# Patient Record
Sex: Female | Born: 2000 | Race: Black or African American | Hispanic: No | Marital: Single | Smoking: Never smoker
Health system: Southern US, Community
[De-identification: ages and names within clinical notes are randomized; demographics above are authoritative.]

## PROBLEM LIST (undated history)

## (undated) DIAGNOSIS — K912 Postsurgical malabsorption, not elsewhere classified: Secondary | ICD-10-CM

## (undated) DIAGNOSIS — K90829 Short bowel syndrome, unspecified: Secondary | ICD-10-CM

## (undated) HISTORY — PX: OSTOMY TAKE DOWN: SHX2142

---
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
## 2022-11-03 ENCOUNTER — Encounter (HOSPITAL_COMMUNITY): Payer: Self-pay

## 2022-11-03 ENCOUNTER — Ambulatory Visit (HOSPITAL_COMMUNITY)
Admission: EM | Admit: 2022-11-03 | Discharge: 2022-11-03 | Disposition: A | Discharge status: Home / Self Care | Payer: Medicaid Other | Attending: Physician Assistant | Admitting: Physician Assistant

## 2022-11-03 DIAGNOSIS — R350 Frequency of micturition: Secondary | ICD-10-CM

## 2022-11-03 DIAGNOSIS — J020 Streptococcal pharyngitis: Secondary | ICD-10-CM | POA: Diagnosis not present

## 2022-11-03 LAB — POCT URINALYSIS DIPSTICK, ED / UC
Bilirubin Urine: NEGATIVE
Glucose, UA: NEGATIVE mg/dL
Hgb urine dipstick: NEGATIVE
Ketones, ur: 15 mg/dL — AB
Leukocytes,Ua: NEGATIVE
Nitrite: NEGATIVE
Protein, ur: NEGATIVE mg/dL
Specific Gravity, Urine: 1.02 (ref 1.005–1.030)
Urobilinogen, UA: 4 mg/dL — ABNORMAL HIGH (ref 0.0–1.0)
pH: 7 (ref 5.0–8.0)

## 2022-11-03 LAB — POC URINE PREG, ED: Preg Test, Ur: NEGATIVE

## 2022-11-03 LAB — POCT RAPID STREP A, ED / UC: Streptococcus, Group A Screen (Direct): POSITIVE — AB

## 2022-11-03 MED ORDER — METHYLPREDNISOLONE SODIUM SUCC 125 MG IJ SOLR
60.0000 mg | Freq: Once | INTRAMUSCULAR | Status: AC
  Administered 2022-11-03: 60 mg via INTRAMUSCULAR

## 2022-11-03 MED ORDER — AMOXICILLIN 500 MG PO CAPS: 500.0000 mg | ORAL_CAPSULE | Freq: Two times a day (BID) | ORAL | 0 refills | Status: AC

## 2022-11-03 MED ORDER — METHYLPREDNISOLONE SODIUM SUCC 125 MG IJ SOLR
INTRAMUSCULAR | Status: AC
  Filled 2022-11-03: qty 2

## 2022-11-03 NOTE — ED Provider Notes (Signed)
[place]    CSN: [id number] Arrival date & time: 11/03/22  1007      History   Chief Complaint Chief Complaint  Patient presents with   Fever   Chills   Headache    HPI Dana Charles is a 22 y.o. female.   Patient presents today with a 24-hour history of fever, chills, widespread body pain, headache, sore throat.  Denies any significant cough, congestion, nausea, vomiting, diarrhea.  She has tried ibuprofen without improvement of symptoms.  Denies any known sick contacts.  She has had COVID several years ago.  Has not had influenza or COVID-19 vaccinations.  Denies any recent antibiotics or steroids.  She does have a history of asthma when she was younger but has not required albuterol recently.  Denies hospitalization related to asthma.  She does not believe that she could be pregnant but is not confident.  She does report some urinary symptoms including frequency but states this has been ongoing.  Denies any dysuria, hematuria, pelvic pain, vaginal symptoms.    Past Medical History:  Diagnosis Date   Asthma    Short gut syndrome     There are no problems to display for this patient.   Past Surgical History:  Procedure Laterality Date   OSTOMY TAKE DOWN      OB History   No obstetric history on file.      Home Medications    Prior to Admission medications   Medication Sig Start Date End Date Taking? Authorizing Provider  amoxicillin (AMOXIL) 500 MG capsule Take 1 capsule (500 mg total) by mouth 2 (two) times daily. 11/03/22  Yes Javarius Tsosie K, PA-C  albuterol (PROVENTIL HFA;VENTOLIN HFA) 108 (90 BASE) MCG/ACT inhaler Inhale 2 puffs into the lungs every 6 (six) hours as needed. For shortness of breath     [provider]    Family History History reviewed. No pertinent family history.  Social History Social History   Tobacco Use   Smoking status: Never   Smokeless tobacco: Never     Allergies   Patient has no known  allergies.   Review of Systems Review of Systems  Constitutional:  Positive for activity change, appetite change, chills and fever. Negative for fatigue.  HENT:  Positive for sore throat. Negative for congestion, sinus pressure, sneezing and trouble swallowing.   Respiratory:  Negative for cough and shortness of breath.   Cardiovascular:  Negative for chest pain.  Gastrointestinal:  Negative for abdominal pain, diarrhea, nausea and vomiting.  Genitourinary:  Positive for frequency. Negative for dysuria and urgency.  Musculoskeletal:  Positive for arthralgias and myalgias.  Neurological:  Positive for headaches. Negative for dizziness and light-headedness.     Physical Exam Triage Vital Signs ED Triage Vitals [11/03/22 1105]  Enc Vitals Group     BP 115/70     Pulse Rate (!) 141     Resp 16     Temp 99.8 F (37.7 C)     Temp Source Oral     SpO2 99 %     Weight      Height      Head Circumference      Peak Flow      Pain Score      Pain Loc      Pain Edu?      Excl. in [place]?    No data found.  Updated Vital Signs BP 115/70 (BP Location: Left Arm)   Pulse 98   Temp  99.8 F (37.7 C) (Oral)   Resp 16   LMP 10/21/2022   SpO2 99%   Visual Acuity Right Eye Distance:   Left Eye Distance:   Bilateral Distance:    Right Eye Near:   Left Eye Near:    Bilateral Near:     Physical Exam Vitals reviewed.  Constitutional:      General: She is awake. She is not in acute distress.    Appearance: Normal appearance. She is well-developed. She is not ill-appearing.     Comments: Very pleasant female appears stated age in no acute distress sitting comfortably in exam room  HENT:     Head: Normocephalic and atraumatic.     Right Ear: Tympanic membrane, ear canal and external ear normal. Tympanic membrane is not erythematous or bulging.     Left Ear: Tympanic membrane, ear canal and external ear normal. Tympanic membrane is not erythematous or bulging.     Nose:     Right  Sinus: No maxillary sinus tenderness or frontal sinus tenderness.     Left Sinus: No maxillary sinus tenderness or frontal sinus tenderness.     Mouth/Throat:     Pharynx: Uvula midline. Posterior oropharyngeal erythema present. No oropharyngeal exudate.     Tonsils: Tonsillar exudate present. No tonsillar abscesses. 2+ on the right. 2+ on the left.  Cardiovascular:     Rate and Rhythm: Normal rate and regular rhythm.     Heart sounds: Normal heart sounds, S1 normal and S2 normal. No murmur heard. Pulmonary:     Effort: Pulmonary effort is normal.     Breath sounds: Normal breath sounds. No wheezing, rhonchi or rales.     Comments: Clear to auscultation bilaterally Abdominal:     General: Bowel sounds are normal.     Palpations: Abdomen is soft.     Tenderness: There is no abdominal tenderness. There is no right CVA tenderness, left CVA tenderness, guarding or rebound.  Lymphadenopathy:     Head:     Right side of head: No submental, submandibular or tonsillar adenopathy.     Left side of head: No submental, submandibular or tonsillar adenopathy.     Cervical: No cervical adenopathy.  Psychiatric:        Behavior: Behavior is cooperative.      UC Treatments / Results  Labs (all labs ordered are listed, but only abnormal results are displayed) Labs Reviewed  POCT RAPID STREP A, ED / UC - Abnormal; Notable for the following components:      Result Value   Streptococcus, Group A Screen (Direct) POSITIVE (*)    All other components within normal limits  POCT URINALYSIS DIPSTICK, ED / UC - Abnormal; Notable for the following components:   Ketones, ur 15 (*)    Urobilinogen, UA 4.0 (*)    All other components within normal limits  POC URINE PREG, ED    EKG   Radiology No results found.  Procedures Procedures (including critical care time)  Medications Ordered in UC Medications  methylPREDNISolone sodium succinate (SOLU-MEDROL) 125 mg/2 mL injection 60 mg (60 mg  Intramuscular Given 11/03/22 1149)    Initial Impression / Assessment and Plan / UC Course  I have reviewed the triage vital signs and the nursing notes.  Pertinent labs & imaging results that were available during my care of the patient were reviewed by me and considered in my medical decision making (see chart for details).     Patient was tachycardic but this  normalized on retake.  She is well-appearing, afebrile, nontoxic.  Urine pregnancy was obtained and was negative.  She did report some urinary frequency and so UA was obtained which did not show any evidence of infection.  She tested positive for strep pharyngitis.  She was started on amoxicillin 500 mg twice daily for 10 days.  Discussed that she should dispose of her toothbrush a few days after starting medication to prevent reinfection.  She is contagious for 24 hours after starting medicine.  Given swelling and pain of tonsil she was given an injection of Solu-Medrol in clinic to help manage the symptoms.  Discussed that if her symptoms are not improving within 24 hours she should return for reevaluation.  If she has any worsening symptoms including swelling of her throat, shortness of breath, muffled voice, fever not respond to antipyretics, dysphagia she should be seen immediately.  Strict return precautions given.  Final Clinical Impressions(s) / UC Diagnoses   Final diagnoses:  Strep pharyngitis     Discharge Instructions      You tested positive for strep.  Start amoxicillin twice daily for 10 days.  Throw away your toothbrush a few days after starting the medication in order to prevent reinfection.  Gargle with warm salt water.  Alternate Tylenol ibuprofen for pain.  If your symptoms or not improving within a few days please return for reevaluation.  If you have any difficulty swallowing, shortness of breath, muffled voice, fever not responding medication you should be seen immediately.  You are contagious for 24 hours after  starting the medication.     ED Prescriptions     Medication Sig Dispense Auth. Provider   amoxicillin (AMOXIL) 500 MG capsule Take 1 capsule (500 mg total) by mouth 2 (two) times daily. 20 capsule Shyne Lehrke, Derry Skill, PA-C      PDMP not reviewed this encounter.   Terrilee Croak, PA-C 11/03/22 1203

## 2022-11-03 NOTE — Discharge Instructions (Addendum)
You tested positive for strep.  Start amoxicillin twice daily for 10 days.  Throw away your toothbrush a few days after starting the medication in order to prevent reinfection.  Gargle with warm salt water.  Alternate Tylenol ibuprofen for pain.  If your symptoms or not improving within a few days please return for reevaluation.  If you have any difficulty swallowing, shortness of breath, muffled voice, fever not responding medication you should be seen immediately.  You are contagious for 24 hours after starting the medication.

## 2022-11-03 NOTE — ED Triage Notes (Signed)
2- day h/o headache,body aches and chills. Pt thinks she has the flu.

## 2023-01-22 ENCOUNTER — Telehealth (INDEPENDENT_AMBULATORY_CARE_PROVIDER_SITE_OTHER): Payer: Medicaid Other

## 2023-01-22 DIAGNOSIS — Z3492 Encounter for supervision of normal pregnancy, unspecified, second trimester: Secondary | ICD-10-CM

## 2023-01-22 DIAGNOSIS — Z3490 Encounter for supervision of normal pregnancy, unspecified, unspecified trimester: Secondary | ICD-10-CM | POA: Insufficient documentation

## 2023-01-22 DIAGNOSIS — Z3480 Encounter for supervision of other normal pregnancy, unspecified trimester: Secondary | ICD-10-CM

## 2023-01-22 DIAGNOSIS — Z3689 Encounter for other specified antenatal screening: Secondary | ICD-10-CM

## 2023-01-22 MED ORDER — BLOOD PRESSURE KIT DEVI: 1.0000 | 0 refills | Status: DC | PRN

## 2023-01-22 MED ORDER — BLOOD PRESSURE KIT DEVI: 1.0000 | 0 refills | Status: AC | PRN

## 2023-01-22 NOTE — Patient Instructions (Addendum)

## 2023-01-22 NOTE — Addendum Note (Signed)
Addended by: Brien Mates T on: 01/22/2023 02:32 PM   Modules accepted: Orders

## 2023-01-22 NOTE — Progress Notes (Signed)
New OB Intake  I connected with Dana Charles  on 01/22/23 at  9:15 AM EDT by MyChart Video Visit and verified that I am speaking with the correct person using two identifiers. Nurse is located at [facility] and pt is located at home.  I discussed the limitations, risks, security and privacy concerns of performing an evaluation and management service by telephone and the availability of in person appointments. I also discussed with the patient that there may be a patient responsible charge related to this service. The patient expressed understanding and agreed to proceed.  I explained I am completing New OB Intake today. We discussed EDD of 07/28/2023 that is based on LMP of 10/21/2022. Pt is G1/P0. I reviewed her allergies, medications, Medical/Surgical/OB history, and appropriate screenings. I informed her of [facility] services. [facility] information placed in AVS. Based on history, this is a low risk pregnancy.  There are no problems to display for this patient.   Concerns addressed today  Delivery Plans Plans to deliver at [facility]. Patient given information for [facility] Healthy [facility] website for more information about [facility].   MyChart/Babyscripts MyChart access verified. I explained pt will have some visits in office and some virtually. Babyscripts instructions given and order placed. Patient verifies receipt of registration text/e-mail. Account successfully created and app downloaded.  Blood Pressure Cuff/Weight Scale Blood pressure cuff ordered for patient to pick-up from [employer]. Explained after first prenatal appt pt will check weekly and document in Babyscripts.   Anatomy US Explained first scheduled [place] will be around 19 weeks. Anatomy US scheduled for 03/06/2023 at 8:30am. Pt notified to arrive at 8:15am.  Labs Discussed Natera genetic screening with patient. Would like both Panorama and Horizon drawn at new OB visit. Routine prenatal labs needed.  COVID  Vaccine Patient has had COVID vaccine.   Is patient a CenteringPregnancy candidate?  Not a Candidate Declined due to Enrolled in [facility]   Is patient a Mom+Baby Combined Care candidate?  Accepted   If accepted, Mom+Baby staff notified  Social Determinants of Health Food Insecurity: Patient denies food insecurity. WIC Referral: Patient is interested in referral to [facility]  Transportation: Patient denies transportation needs. Childcare: Discussed no children allowed at ultrasound appointments. Offered childcare services; patient declines childcare services at this time.  Interested in [place]? If yes, send referral and doula dot phrase.   First visit review I reviewed new OB appt with patient. I explained they will have a provider visit that includes new ob labs and listening to baby heartbeat. Explained pt will be seen by Edd Arbour, CNM at first visit; encounter routed to appropriate provider. Explained that patient will be seen by pregnancy navigator following visit with provider.   Lowry Bowl, CMA 01/22/2023  8:59 AM

## 2023-01-27 ENCOUNTER — Other Ambulatory Visit: Payer: Self-pay

## 2023-01-27 ENCOUNTER — Other Ambulatory Visit (HOSPITAL_COMMUNITY)
Admission: RE | Admit: 2023-01-27 | Discharge: 2023-01-27 | Disposition: A | Discharge status: Home / Self Care | Payer: Medicaid Other | Source: Ambulatory Visit | Attending: Certified Nurse Midwife | Admitting: Certified Nurse Midwife

## 2023-01-27 ENCOUNTER — Ambulatory Visit (INDEPENDENT_AMBULATORY_CARE_PROVIDER_SITE_OTHER): Payer: Medicaid Other | Admitting: Family Medicine

## 2023-01-27 ENCOUNTER — Encounter: Payer: Self-pay | Admitting: Family Medicine

## 2023-01-27 VITALS — BP 111/80 | HR 96 | Wt 128.4 lb

## 2023-01-27 DIAGNOSIS — L292 Pruritus vulvae: Secondary | ICD-10-CM | POA: Insufficient documentation

## 2023-01-27 DIAGNOSIS — B3731 Acute candidiasis of vulva and vagina: Secondary | ICD-10-CM

## 2023-01-27 DIAGNOSIS — J4520 Mild intermittent asthma, uncomplicated: Secondary | ICD-10-CM | POA: Diagnosis not present

## 2023-01-27 DIAGNOSIS — Z3492 Encounter for supervision of normal pregnancy, unspecified, second trimester: Secondary | ICD-10-CM

## 2023-01-27 DIAGNOSIS — J45909 Unspecified asthma, uncomplicated: Secondary | ICD-10-CM | POA: Insufficient documentation

## 2023-01-27 DIAGNOSIS — K90829 Short bowel syndrome, unspecified: Secondary | ICD-10-CM | POA: Diagnosis not present

## 2023-01-27 NOTE — Addendum Note (Signed)
Addended by: Geanie Berlin on: 01/27/2023 02:06 PM   Modules accepted: Orders

## 2023-01-27 NOTE — Progress Notes (Signed)
INITIAL PRENATAL VISIT  Subjective:   Dana Charles is being seen today for her first obstetrical visit.  This is not a planned pregnancy. This is a desired pregnancy.  She is at [redacted]w[redacted]d gestation by LMP. 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 Relationship with FOB:  involved . Patient does intend to breast feed. Pregnancy history fully reviewed.  Patient reports no complaints- has noticed labial itching. Ice helps  Indications for ASA therapy (per uptodate) One of the following: Previous pregnancy with preeclampsia, especially early onset and with an adverse outcome No Multifetal gestation No Chronic hypertension No Type 1 or 2 diabetes mellitus No Chronic kidney disease No Autoimmune disease (antiphospholipid syndrome, systemic lupus erythematosus) No  Two or more of the following: Nulliparity Yes Obesity (body mass index >30 kg/m2) No Family history of preeclampsia in mother or sister No Age ?35 years No Sociodemographic characteristics (African American race, low socioeconomic level) Yes Personal risk factors (eg, previous pregnancy with low birth weight or small for gestational age infant, previous adverse pregnancy outcome [eg, stillbirth], interval >10 years between pregnancies) No  Indications for early GDM screening  First-degree relative with diabetes No BMI >30kg/m2 No Age > 25 No Previous birth of an infant weighing ?4000 g No Gestational diabetes mellitus in a previous pregnancy No Glycated hemoglobin ?5.7 percent (39 mmol/mol), impaired glucose tolerance, or impaired fasting glucose on previous testing No High-risk race/ethnicity (eg, African American, Latino, Native American, Asian American, Pacific Islander) Yes Previous stillbirth of unknown cause No Maternal birthweight > 9 lbs No History of cardiovascular disease No Hypertension or on therapy for hypertension No High-density lipoprotein cholesterol level <35  mg/dL (4.13 mmol/L) and/or a triglyceride level >250 mg/dL (2.44 mmol/L) No Polycystic ovary syndrome No Physical inactivity No Other clinical condition associated with insulin resistance (eg, severe obesity, acanthosis nigricans) No Current use of glucocorticoids No   Early screening tests: FBS, A1C, Random CBG, glucose challenge   Review of Systems:   Review of Systems  Objective:    Obstetric History OB History  Gravida Para Term Preterm AB Living  1            SAB IAB Ectopic Multiple Live Births               # Outcome Date GA Lbr Len/2nd Weight Sex Delivery Anes PTL Lv  1 Current             Past Medical History:  Diagnosis Date   Asthma    Constipation 07/07/2013   H/O resection of small bowel 07/07/2013   Hematochezia 12/25/2014   Irritable bowel syndrome 08/06/2011   Short gut syndrome     Past Surgical History:  Procedure Laterality Date   OSTOMY TAKE DOWN      Current Outpatient Medications on File Prior to Visit  Medication Sig Dispense Refill   albuterol (PROVENTIL HFA;VENTOLIN HFA) 108 (90 BASE) MCG/ACT inhaler Inhale 2 puffs into the lungs every 6 (six) hours as needed. For shortness of breath      ALBUTEROL IN Inhale into the lungs.     Blood Pressure Monitoring (BLOOD PRESSURE KIT) DEVI 1 each by Does not apply route as needed. 1 each 0   Prenatal Vit-DSS-Fe Fum-FA (PRENATAL 19) tablet Take 1 tablet by mouth daily.     Prenatal Vit-Fe Fumarate-FA (PRENATAL VITAMINS) 28-0.8 MG TABS Take 1 tablet by mouth daily.     amoxicillin (AMOXIL) 500 MG capsule Take  1 capsule (500 mg total) by mouth 2 (two) times daily. (Patient not taking: Reported on 01/22/2023) 20 capsule 0   fluticasone-salmeterol (ADVAIR DISKUS) 100-50 MCG/ACT AEPB Inhale into the lungs. (Patient not taking: Reported on 01/22/2023)     hyoscyamine (LEVSIN) 0.125 MG tablet Take by mouth. (Patient not taking: Reported on 01/22/2023)     polyethylene glycol powder (GLYCOLAX/MIRALAX) 17 GM/SCOOP  powder 17 grams po hourly until stools clear, then 17 grams daily. Adjust daily dose for goal stools (Patient not taking: Reported on 01/22/2023)     No current facility-administered medications on file prior to visit.    No Known Allergies  Social History:  reports that she has never smoked. She has never used smokeless tobacco. She reports that she does not currently use drugs after having used the following drugs: Marijuana. She reports that she does not drink alcohol.  Family History  Problem Relation Age of Onset   Lupus Mother    Narcolepsy Mother    Healthy Father     The following portions of the patient's history were reviewed and updated as appropriate: allergies, current medications, past family history, past medical history, past social history, past surgical history and problem list.  Review of Systems Review of Systems  Constitutional:  Negative for chills and fever.  HENT:  Negative for congestion and sore throat.   Eyes:  Negative for pain and visual disturbance.  Respiratory:  Negative for cough, chest tightness and shortness of breath.   Cardiovascular:  Negative for chest pain.  Gastrointestinal:  Positive for constipation (chronic and manages with miralax). Negative for abdominal pain, diarrhea, nausea and vomiting.  Endocrine: Negative for cold intolerance and heat intolerance.  Genitourinary:  Negative for dysuria and flank pain.  Musculoskeletal:  Negative for back pain.  Skin:  Negative for rash.  Allergic/Immunologic: Negative for food allergies.  Neurological:  Negative for dizziness and light-headedness.  Psychiatric/Behavioral:  Negative for agitation.       Physical Exam:  BP 111/80   Pulse 96   Wt 128 lb 6.4 oz (58.2 kg)   LMP 10/21/2022  CONSTITUTIONAL: Well-developed, well-nourished female in no acute distress.  HENT:  Normocephalic, atraumatic, External right and left ear normal. Oropharynx is clear and moist EYES: Conjunctivae normal. No  scleral icterus.  NECK: Normal range of motion, supple, no masses.  Normal thyroid.  SKIN: Skin is warm and dry. No rash noted. Not diaphoretic. No erythema. No pallor. MUSCULOSKELETAL: Normal range of motion. No tenderness.  No cyanosis, clubbing, or edema.   NEUROLOGIC: Alert and oriented to person, place, and time. Normal muscle tone coordination.  PSYCHIATRIC: Normal mood and affect. Normal behavior. Normal judgment and thought content. CARDIOVASCULAR: Normal heart rate noted, regular rhythm RESPIRATORY: Clear to auscultation bilaterally. Effort and breath sounds normal, no problems with respiration noted. BREASTS: Symmetric in size. No masses, skin changes, nipple drainage, or lymphadenopathy. ABDOMEN: Soft, normal bowel sounds, no distention noted.  No tenderness, rebound or guarding. Fundal ht: 14. 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  PELVIC: Normal appearing external genitalia- no rash or lesions; normal appearing vaginal mucosa and cervix.  +Thick white discharge.  .  Normal uterine size, no other palpable masses, no uterine or adnexal tenderness. FHR: 153   Assessment:    Pregnancy: G1P0000 1. Encounter for supervision of low-risk pregnancy in second trimester - Reviewed care team and [facility] model - CBC/D/Plt+RPR+Rh+ABO+RubIgG... - GC/Chlamydia probe amp ([place])not at [facility] - Culture, OB Urine - Hemoglobin A1c - Panorama  and Horizon also ordered  2. 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 She reported her gallbladder was also removed - review of Care everywhere-- had bowel resection at possibly 22 yo for blockage? (See in outpatient Office note when patient was 22 yo).  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 - Patient was seen relatively recently for abdominal pain thought to be related to adherence of her  colostomy skin scar to her abdominal fascia      Plan:     Initial labs drawn. Prenatal vitamins. Problem list reviewed and updated. Reviewed in detail the nature of the practice with collaborative care between  Genetic screening discussed: NIPS/ ordered. Role of ultrasound in pregnancy discussed; Anatomy US: ordered. Amniocentesis discussed: not indicated. Follow up in 4 weeks. Discussed clinic routines, schedule of care and testing, genetic screening options, involvement of students and residents under the direct supervision of APPs and doctors and presence of female providers. Pt verbalized understanding.  Future Appointments  Date Time Provider Department Center  02/24/2023 10:55 AM Sue Lush, FNP [facility]  03/06/2023  8:30 AM [facility]  03/26/2023  3:55 PM Federico Flake, MD [facility]     Federico Flake, MD 01/27/2023 12:53 PM

## 2023-01-28 LAB — CBC/D/PLT+RPR+RH+ABO+RUBIGG...
Antibody Screen: NEGATIVE
Basophils Absolute: 0.1 10*3/uL (ref 0.0–0.2)
Basos: 1 %
EOS (ABSOLUTE): 0.1 10*3/uL (ref 0.0–0.4)
Eos: 1 %
HCV Ab: NONREACTIVE
HIV Screen 4th Generation wRfx: NONREACTIVE
Hematocrit: 38.4 % (ref 34.0–46.6)
Hemoglobin: 12.3 g/dL (ref 11.1–15.9)
Hepatitis B Surface Ag: NEGATIVE
Immature Grans (Abs): 0 10*3/uL (ref 0.0–0.1)
Immature Granulocytes: 0 %
Lymphocytes Absolute: 1.7 10*3/uL (ref 0.7–3.1)
Lymphs: 22 %
MCH: 27.5 pg (ref 26.6–33.0)
MCHC: 32 g/dL (ref 31.5–35.7)
MCV: 86 fL (ref 79–97)
Monocytes Absolute: 0.6 10*3/uL (ref 0.1–0.9)
Monocytes: 8 %
Neutrophils Absolute: 5.1 10*3/uL (ref 1.4–7.0)
Neutrophils: 68 %
Platelets: 234 10*3/uL (ref 150–450)
RBC: 4.48 x10E6/uL (ref 3.77–5.28)
RDW: 14.9 % (ref 11.7–15.4)
RPR Ser Ql: NONREACTIVE
Rh Factor: POSITIVE
Rubella Antibodies, IGG: 3.81 index (ref >0.99)
WBC: 7.5 10*3/uL (ref 3.4–10.8)

## 2023-01-28 LAB — CERVICOVAGINAL ANCILLARY ONLY
Bacterial Vaginitis (gardnerella): NEGATIVE
Candida Glabrata: NEGATIVE
Candida Vaginitis: POSITIVE — AB
Comment: NEGATIVE
Comment: NEGATIVE
Comment: NEGATIVE
Comment: NEGATIVE
Trichomonas: NEGATIVE

## 2023-01-28 LAB — HCV INTERPRETATION

## 2023-01-28 LAB — HEMOGLOBIN A1C
Est. average glucose Bld gHb Est-mCnc: 100 mg/dL
Hgb A1c MFr Bld: 5.1 % (ref 4.8–5.6)

## 2023-01-28 LAB — GC/CHLAMYDIA PROBE AMP (~~LOC~~) NOT AT ARMC
Chlamydia: NEGATIVE
Comment: NEGATIVE
Comment: NORMAL
Neisseria Gonorrhea: NEGATIVE

## 2023-01-28 MED ORDER — CLOTRIMAZOLE 1 % VA CREA: 1.0000 | TOPICAL_CREAM | Freq: Every day | VAGINAL | 2 refills | Status: DC

## 2023-01-28 NOTE — Addendum Note (Signed)
Addended by: Geanie Berlin on: 01/28/2023 11:05 PM   Modules accepted: Orders

## 2023-01-29 ENCOUNTER — Encounter: Payer: Medicaid Other | Admitting: Certified Nurse Midwife

## 2023-01-29 LAB — CULTURE, OB URINE

## 2023-01-29 LAB — URINE CULTURE, OB REFLEX

## 2023-02-02 LAB — PANORAMA PRENATAL TEST FULL PANEL:PANORAMA TEST PLUS 5 ADDITIONAL MICRODELETIONS: FETAL FRACTION: 6.9

## 2023-02-04 LAB — HORIZON CUSTOM: REPORT SUMMARY: POSITIVE — AB

## 2023-02-05 ENCOUNTER — Encounter: Payer: Self-pay | Admitting: Family Medicine

## 2023-02-06 ENCOUNTER — Telehealth: Payer: Self-pay | Admitting: Lactation Services

## 2023-02-06 NOTE — Telephone Encounter (Signed)
Per chart reviewed, patient has not read My Chart message for Tx of yeast. LM for her to check her My Chart message and follow instructions for treatment.

## 2023-02-10 MED ORDER — TERCONAZOLE 0.4 % VA CREA: 1.0000 | TOPICAL_CREAM | Freq: Every day | VAGINAL | 0 refills | Status: AC

## 2023-02-10 NOTE — Addendum Note (Signed)
Addended by: Isabell Jarvis on: 02/10/2023 10:59 AM   Modules accepted: Orders

## 2023-02-12 ENCOUNTER — Encounter: Payer: Self-pay | Admitting: Family Medicine

## 2023-02-24 ENCOUNTER — Encounter: Payer: Medicaid Other | Admitting: Obstetrics and Gynecology

## 2023-03-04 DIAGNOSIS — D563 Thalassemia minor: Secondary | ICD-10-CM | POA: Insufficient documentation

## 2023-03-06 ENCOUNTER — Ambulatory Visit: Payer: Medicaid Other | Attending: Certified Nurse Midwife

## 2023-03-06 ENCOUNTER — Ambulatory Visit: Payer: Medicaid Other

## 2023-03-06 DIAGNOSIS — D563 Thalassemia minor: Secondary | ICD-10-CM

## 2023-03-26 ENCOUNTER — Encounter: Payer: Medicaid Other | Admitting: Family Medicine
